# Patient Record
Sex: Female | Born: 1960 | Race: White | Hispanic: No | Smoking: Current every day smoker
Health system: Southern US, Community
[De-identification: ages and names within clinical notes are randomized; demographics above are authoritative.]

## PROBLEM LIST (undated history)

## (undated) DIAGNOSIS — D649 Anemia, unspecified: Secondary | ICD-10-CM

## (undated) DIAGNOSIS — F419 Anxiety disorder, unspecified: Secondary | ICD-10-CM

## (undated) HISTORY — DX: Anemia, unspecified: D64.9

## (undated) HISTORY — PX: TONSILLECTOMY: SUR1361

## (undated) HISTORY — DX: Anxiety disorder, unspecified: F41.9

---

## 2008-01-28 ENCOUNTER — Ambulatory Visit: Payer: Self-pay | Admitting: Family Medicine

## 2008-10-10 ENCOUNTER — Ambulatory Visit: Payer: Self-pay | Admitting: Family Medicine

## 2008-11-07 ENCOUNTER — Ambulatory Visit: Payer: Self-pay | Admitting: Unknown Physician Specialty

## 2009-10-24 ENCOUNTER — Ambulatory Visit: Payer: Self-pay | Admitting: Family Medicine

## 2012-05-03 ENCOUNTER — Ambulatory Visit: Payer: PRIVATE HEALTH INSURANCE

## 2012-05-03 ENCOUNTER — Ambulatory Visit (INDEPENDENT_AMBULATORY_CARE_PROVIDER_SITE_OTHER): Payer: PRIVATE HEALTH INSURANCE | Admitting: Family Medicine

## 2012-05-03 VITALS — BP 135/73 | HR 67 | Temp 98.7°F | Resp 16 | Ht 62.5 in | Wt 193.0 lb

## 2012-05-03 DIAGNOSIS — Z720 Tobacco use: Secondary | ICD-10-CM

## 2012-05-03 DIAGNOSIS — F329 Major depressive disorder, single episode, unspecified: Secondary | ICD-10-CM

## 2012-05-03 DIAGNOSIS — J449 Chronic obstructive pulmonary disease, unspecified: Secondary | ICD-10-CM

## 2012-05-03 DIAGNOSIS — N63 Unspecified lump in unspecified breast: Secondary | ICD-10-CM

## 2012-05-03 DIAGNOSIS — F17200 Nicotine dependence, unspecified, uncomplicated: Secondary | ICD-10-CM

## 2012-05-03 DIAGNOSIS — F430 Acute stress reaction: Secondary | ICD-10-CM

## 2012-05-03 DIAGNOSIS — R05 Cough: Secondary | ICD-10-CM

## 2012-05-03 DIAGNOSIS — M722 Plantar fascial fibromatosis: Secondary | ICD-10-CM

## 2012-05-03 DIAGNOSIS — R928 Other abnormal and inconclusive findings on diagnostic imaging of breast: Secondary | ICD-10-CM

## 2012-05-03 DIAGNOSIS — J9801 Acute bronchospasm: Secondary | ICD-10-CM

## 2012-05-03 MED ORDER — TIOTROPIUM BROMIDE MONOHYDRATE 18 MCG IN CAPS: 18.0000 ug | ORAL_CAPSULE | Freq: Every day | RESPIRATORY_TRACT | Status: AC

## 2012-05-03 MED ORDER — BUPROPION HCL ER (SR) 150 MG PO TB12: 150.0000 mg | ORAL_TABLET | Freq: Two times a day (BID) | ORAL | Status: AC

## 2012-05-03 MED ORDER — LORAZEPAM 1 MG PO TABS: 1.0000 mg | ORAL_TABLET | Freq: Every day | ORAL | Status: AC

## 2012-05-03 MED ORDER — NAPROXEN 500 MG PO TABS: 500.0000 mg | ORAL_TABLET | Freq: Two times a day (BID) | ORAL | Status: AC

## 2012-05-03 NOTE — Progress Notes (Signed)
[address]   [place], [place]  [address]   [phone]  Subjective:    Patient ID: Susan Villanueva, female    DOB: 1960/11/21, 51 y.o.   MRN: [id number]  HPIThis 51 y.o. female presents to establish care and for acute visit:   1.  R breast lump, bruising.  Noticed two weeks ago; knot is smaller since bruising is improving/decreasing.  Notices more when laying supine.  No regular monthly breasts examination.  Last mammogram 12-11-09.    2.  Stress: mother died suddenly.  Daughter moved to [place] and has now moved home.  Ate [employer] with mother; mother developed waves of stomach pain.  Vomiting all day; minimal fluid intake.  Called EMT.  Had SBO; admitted for observation; placed NG tube.  Made her drink Barium.  Onset of back pain severely.  Up all night.  Pt went home at 3:00am; woke up at 9:00am.  Put on ventilator until family could get in.  Died with withdrawal.  Taking nerve pill at increased frequency due to stress.  Lorazepam 1mg  daily.  3.  Wheezing, coughing: smoking 2 ppd.   No recent colds; wheezes at baseline.  No fever/chills/sweats.  No bloody sputum.  4.  Teeth pain: had some teeth replaced; needs refill of Naproxen for PRN teeth pain.    5. Arthralgias:  Needs refill of Naproxen.  Continues to suffer with plantar fasciitis; working two jobs.    6.  Menopause:  LMP three months ago.  +hot flashes but tolerable.  No dysmenorrhea.  +sexually active.   Review of Systems  Constitutional: Negative for fever, chills and fatigue.  Respiratory: Positive for cough and wheezing. Negative for chest tightness and shortness of breath.   Cardiovascular: Negative for chest pain, palpitations and leg swelling.  Genitourinary: Negative for menstrual problem.       See HPI; +breast nodule.  Musculoskeletal: Positive for myalgias and arthralgias. Negative for joint swelling and gait problem.  Psychiatric/Behavioral: Positive for disturbed wake/sleep cycle. Negative for suicidal ideas and  self-injury. The patient is nervous/anxious.        Objective:   Physical Exam  Nursing note and vitals reviewed. Constitutional: She is oriented to person, place, and time. She appears well-developed and well-nourished.  HENT:  Head: Normocephalic and atraumatic.  Eyes: Conjunctivae normal are normal. Pupils are equal, round, and reactive to light.  Neck: Normal range of motion. Neck supple. No thyromegaly present.  Cardiovascular: Normal rate, regular rhythm and normal heart sounds.   No murmur heard. Pulmonary/Chest: Effort normal and breath sounds normal. No respiratory distress. She has no wheezes. She has no rales.  Genitourinary: There is breast tenderness. No breast swelling, discharge or bleeding.       R BREAST: SMALL INDURATION AT 1 O'CLOCK; BRUISING/ECCHYMOSES AT 12 O'CLOCK.  Lymphadenopathy:    She has no cervical adenopathy.  Neurological: She is alert and oriented to person, place, and time.  Psychiatric: She has a normal mood and affect. Her behavior is normal. Judgment and thought content normal.    SPIROMETRY:  FVC 77%, FEV1 74%, FEV1/FVC 96%  -- POOR QUALITY TESTING; ONE ACCEPTABLE TEST; POSSIBLE RESTRICTION.  Results for orders placed in visit on 05/03/12  POCT URINE PREGNANCY      Component Value Range   Preg Test, Ur Negative     [facility] reading (PRIMARY) by  Dr. Katrinka Blazing.  CXR: NAD.      Assessment & Plan:   1. Breast nodule  MM Digital Diagnostic  Bilat, POCT urine pregnancy, DG Chest 2 View  2. Cough  DG Chest 2 View, DG Chest 2 View  3. Bronchospasm    4. Tobacco abuse  DG Chest 2 View  5. Stress reaction  LORazepam (ATIVAN) 1 MG tablet  6. Plantar fasciitis, bilateral  naproxen (NAPROSYN) 500 MG tablet  7. Depression  buPROPion (WELLBUTRIN SR) 150 MG 12 hr tablet  8. COPD (chronic obstructive pulmonary disease)  tiotropium (SPIRIVA HANDIHALER) 18 MCG inhalation capsule     1. Breast Nodule:  New.  Obtain diagnostic mammogram.   2.  Cough:  New.  Obtain CXR.  Likely due to underlying COPD. 3.  Bronchospasm:  New.  Associated with cough, chronic smoking. Trial of Spiriva daily. 4.  Tobacco abuse: contemplative.  Highly encourage smoking cessation with current symptoms of cough, bronchospasm. 5.  Stress reaction:  New.  Many stressors recently; rx for Ativan provided; has been needing Ativan daily thus will add Wellbutrin SR bid. 6.  COPD:  New.  Trial of Spiriva daily.  Repeat spirometry at next visit; poor quality test in office. 7.  Depression:  New.  With anxiety; rx for Ativan and Wellbutrin.   8.  Plantar Fasciitis:  Persistent; refill of Naproxen provided.  Meds ordered this encounter  Medications  . LORazepam (ATIVAN) 1 MG tablet    Sig: Take 1 tablet (1 mg total) by mouth daily.    Dispense:  30 tablet    Refill:  3  . naproxen (NAPROSYN) 500 MG tablet    Sig: Take 1 tablet (500 mg total) by mouth 2 (two) times daily with a meal.    Dispense:  60 tablet    Refill:  5  . buPROPion (WELLBUTRIN SR) 150 MG 12 hr tablet    Sig: Take 1 tablet (150 mg total) by mouth 2 (two) times daily.    Dispense:  60 tablet    Refill:  5  . tiotropium (SPIRIVA HANDIHALER) 18 MCG inhalation capsule    Sig: Place 1 capsule (18 mcg total) into inhaler and inhale daily.    Dispense:  30 capsule    Refill:  12

## 2012-05-03 NOTE — Patient Instructions (Addendum)
1. Breast nodule  MM Digital Diagnostic Bilat, POCT urine pregnancy, DG Chest 2 View  2. Cough  DG Chest 2 View, DG Chest 2 View  3. Bronchospasm    4. Tobacco abuse  DG Chest 2 View  5. Stress reaction  LORazepam (ATIVAN) 1 MG tablet  6. Plantar fasciitis, bilateral  naproxen (NAPROSYN) 500 MG tablet  7. Depression  buPROPion (WELLBUTRIN SR) 150 MG 12 hr tablet  8. COPD (chronic obstructive pulmonary disease)  tiotropium (SPIRIVA HANDIHALER) 18 MCG inhalation capsule     Call Dr. Adolphus Birchwood or Roseanne Kaufman of dermatology.

## 2012-05-05 ENCOUNTER — Ambulatory Visit: Payer: Self-pay | Admitting: Family Medicine

## 2012-05-05 LAB — HM MAMMOGRAPHY: HM Mammogram: NEGATIVE

## 2012-05-10 ENCOUNTER — Telehealth: Payer: Self-pay

## 2012-05-10 NOTE — Telephone Encounter (Signed)
DR Katrinka Blazing. PT'S SLEEPING MEDICATION IS MAKING HER COUGH REALLY BAD DURING THE DAY. [phone]

## 2012-05-11 NOTE — Telephone Encounter (Signed)
Patient states her cough is much worse, she smokes 2 ppd, states Spiriva seems to be making it worse. She states she has always coughed, but now it is worse. I have advised her to come back in for this, she is given Dr  Katrinka Blazing schedule.

## 2012-05-13 ENCOUNTER — Encounter: Payer: Self-pay | Admitting: Radiology

## 2012-05-13 ENCOUNTER — Telehealth: Payer: Self-pay | Admitting: Radiology

## 2012-05-13 NOTE — Telephone Encounter (Signed)
Called patient to advise her Mammogram was normal, her voicemail not set up yet. Will try again later.

## 2012-05-16 NOTE — Telephone Encounter (Signed)
Pt.notified

## 2012-07-06 NOTE — Progress Notes (Signed)
Reviewed and agree.

## 2012-09-07 ENCOUNTER — Encounter: Payer: Self-pay | Admitting: Family Medicine

## 2012-09-07 ENCOUNTER — Ambulatory Visit (INDEPENDENT_AMBULATORY_CARE_PROVIDER_SITE_OTHER): Payer: PRIVATE HEALTH INSURANCE | Admitting: Family Medicine

## 2012-09-07 VITALS — BP 120/72 | HR 68 | Temp 98.3°F | Resp 16 | Ht 62.5 in | Wt 199.2 lb

## 2012-09-07 DIAGNOSIS — F411 Generalized anxiety disorder: Secondary | ICD-10-CM

## 2012-09-07 DIAGNOSIS — Z01419 Encounter for gynecological examination (general) (routine) without abnormal findings: Secondary | ICD-10-CM

## 2012-09-07 DIAGNOSIS — J9801 Acute bronchospasm: Secondary | ICD-10-CM

## 2012-09-07 DIAGNOSIS — F17200 Nicotine dependence, unspecified, uncomplicated: Secondary | ICD-10-CM

## 2012-09-07 DIAGNOSIS — Z716 Tobacco abuse counseling: Secondary | ICD-10-CM

## 2012-09-07 DIAGNOSIS — Z0000 Encounter for general adult medical examination without abnormal findings: Secondary | ICD-10-CM

## 2012-09-07 DIAGNOSIS — Z7189 Other specified counseling: Secondary | ICD-10-CM

## 2012-09-07 LAB — POCT UA - MICROSCOPIC ONLY
Casts, Ur, LPF, POC: NEGATIVE
Crystals, Ur, HPF, POC: NEGATIVE
Yeast, UA: NEGATIVE

## 2012-09-07 LAB — HEMOGLOBIN A1C
Hgb A1c MFr Bld: 6 % — ABNORMAL HIGH (ref <5.7)
Mean Plasma Glucose: 126 mg/dL — ABNORMAL HIGH (ref <117)

## 2012-09-07 LAB — CBC WITH DIFFERENTIAL/PLATELET
Basophils Relative: 1 % (ref 0–1)
Eosinophils Absolute: 0.3 10*3/uL (ref 0.0–0.7)
Eosinophils Relative: 4 % (ref 0–5)
HCT: 37.6 % (ref 36.0–46.0)
Hemoglobin: 12.9 g/dL (ref 12.0–15.0)
Lymphs Abs: 1.4 10*3/uL (ref 0.7–4.0)
MCH: 30.4 pg (ref 26.0–34.0)
MCHC: 34.3 g/dL (ref 30.0–36.0)
MCV: 88.5 fL (ref 78.0–100.0)
Monocytes Absolute: 0.4 10*3/uL (ref 0.1–1.0)
Monocytes Relative: 6 % (ref 3–12)
RBC: 4.25 MIL/uL (ref 3.87–5.11)

## 2012-09-07 LAB — LIPID PANEL
Cholesterol: 204 mg/dL — ABNORMAL HIGH (ref 0–200)
HDL: 41 mg/dL (ref >39)
Total CHOL/HDL Ratio: 5 Ratio

## 2012-09-07 LAB — POCT URINALYSIS DIPSTICK
Bilirubin, UA: NEGATIVE
Leukocytes, UA: NEGATIVE
Nitrite, UA: NEGATIVE
Protein, UA: NEGATIVE
pH, UA: 5.5

## 2012-09-07 LAB — COMPREHENSIVE METABOLIC PANEL
Alkaline Phosphatase: 78 U/L (ref 39–117)
BUN: 21 mg/dL (ref 6–23)
CO2: 23 mEq/L (ref 19–32)
Creat: 0.66 mg/dL (ref 0.50–1.10)
Glucose, Bld: 106 mg/dL — ABNORMAL HIGH (ref 70–99)
Sodium: 137 mEq/L (ref 135–145)
Total Bilirubin: 0.4 mg/dL (ref 0.3–1.2)
Total Protein: 6.5 g/dL (ref 6.0–8.3)

## 2012-09-07 LAB — VITAMIN B12: Vitamin B-12: 407 pg/mL (ref 211–911)

## 2012-09-07 LAB — TSH: TSH: 2.589 u[IU]/mL (ref 0.350–4.500)

## 2012-09-07 LAB — FOLATE: Folate: 4.1 ng/mL

## 2012-09-07 MED ORDER — VARENICLINE TARTRATE 0.5 MG X 11 & 1 MG X 42 PO MISC: ORAL | Status: DC

## 2012-09-07 MED ORDER — SERTRALINE HCL 50 MG PO TABS: 50.0000 mg | ORAL_TABLET | Freq: Every day | ORAL | Status: AC

## 2012-09-07 MED ORDER — VARENICLINE TARTRATE 1 MG PO TABS: 1.0000 mg | ORAL_TABLET | Freq: Two times a day (BID) | ORAL | Status: DC

## 2012-09-07 NOTE — Patient Instructions (Addendum)
Routine general medical examination at a health care facility - Plan: CBC with Differential, Comprehensive metabolic panel, Hemoglobin A1c, Lipid panel, TSH, Vitamin B12, Vitamin D 25 hydroxy, Folate, POCT urinalysis dipstick, EKG 12-Lead, POCT UA - Microscopic Only  Anxiety state, unspecified  Tobacco abuse counseling  Bronchospasm

## 2012-09-07 NOTE — Progress Notes (Signed)
[address]   [place], [place]  [address]   [phone]  Subjective:    Patient ID: Susan Villanueva, female    DOB: 10-23-1960, 52 y.o.   MRN: [id number]  HPI This 52 y.o. female presents for evaluation for CPE.    Last physical ten years ago. Pap smear gynecology 5 years ago; no gynecological exam since then. Mammogram 06/2012. Colonoscopy never.  TDAP UTD. Pneumovax never. Influenza vaccines never. Eye exam ten years ago; +readers. Dental exam 11/2011.  Anxiety:  Intolerant to Wellbutrin; became very irritable.  Interested in different medication.  Easily irritated.  Bronchospasm:  Suffered with horrible cough with Spiriva; used for one week and stopped; cough resolved when stopping Spiriva.  Now will wheeze with prolonged exertion.   Tobacco abuse: intolerant to Wellbutrin; really would like to try something to help her quit smoking.  Has never used Chantix but willing to try.  Plantar Fasciitis: s/p podiatry consult; s/p B feet injection; no improvement in feet pain; inserts fitted for shoes without improvement.  Labs negative for gout.  Very disappointed with visit.    Review of Systems  Constitutional: Positive for fatigue. Negative for fever, chills, diaphoresis, activity change, appetite change and unexpected weight change.  HENT: Negative for hearing loss, nosebleeds, congestion, sore throat, facial swelling, rhinorrhea, sneezing, drooling, mouth sores, trouble swallowing, neck pain, neck stiffness, dental problem, voice change, postnasal drip, sinus pressure, tinnitus and ear discharge.   Eyes: Negative for photophobia, pain, discharge, redness, itching and visual disturbance.  Respiratory: Positive for cough and wheezing. Negative for apnea, choking, chest tightness, shortness of breath and stridor.   Cardiovascular: Positive for chest pain. Negative for palpitations and leg swelling.  Gastrointestinal: Negative for nausea, vomiting, abdominal pain, diarrhea,  constipation, blood in stool, abdominal distention, anal bleeding and rectal pain.  Endocrine: Negative for cold intolerance, heat intolerance, polydipsia, polyphagia and polyuria.  Genitourinary: Negative for dysuria, urgency, frequency, hematuria, flank pain, decreased urine volume, vaginal bleeding, vaginal discharge, enuresis, difficulty urinating, genital sores, menstrual problem, pelvic pain and dyspareunia.  Musculoskeletal: Positive for myalgias, back pain, joint swelling and arthralgias. Negative for gait problem.  Skin: Negative for color change, pallor, rash and wound.  Allergic/Immunologic: Negative for environmental allergies, food allergies and immunocompromised state.  Neurological: Positive for headaches. Negative for dizziness, tremors, seizures, syncope, facial asymmetry, speech difficulty, weakness, light-headedness and numbness.  Hematological: Negative for adenopathy. Does not bruise/bleed easily.  Psychiatric/Behavioral: Negative for suicidal ideas, hallucinations, behavioral problems, confusion, sleep disturbance, self-injury, dysphoric mood, decreased concentration and agitation. The patient is nervous/anxious. The patient is not hyperactive.         Past Medical History  Diagnosis Date  . Anemia   . Anxiety     Past Surgical History  Procedure Laterality Date  . Tonsillectomy      age 20    Prior to Admission medications   Medication Sig Start Date End Date Taking? Authorizing Provider  LORazepam (ATIVAN) 1 MG tablet Take 1 tablet (1 mg total) by mouth daily. 05/03/12  Yes Ethelda Chick, MD  naproxen (NAPROSYN) 500 MG tablet Take 1 tablet (500 mg total) by mouth 2 (two) times daily with a meal. 05/03/12  Yes Ethelda Chick, MD  OVER THE COUNTER MEDICATION Tart Cherry OTC taking   Yes Historical Provider, MD  OVER THE COUNTER MEDICATION Celery Seed OTC taking   Yes Historical Provider, MD  buPROPion (WELLBUTRIN SR) 150 MG 12 hr tablet Take 1 tablet (150 mg  total)  by mouth 2 (two) times daily. 05/03/12   Ethelda Chick, MD  sertraline (ZOLOFT) 50 MG tablet Take 1 tablet (50 mg total) by mouth daily. For anxiety/nerves 09/07/12   Ethelda Chick, MD  tiotropium (SPIRIVA HANDIHALER) 18 MCG inhalation capsule Place 1 capsule (18 mcg total) into inhaler and inhale daily. 05/03/12   Ethelda Chick, MD  varenicline (CHANTIX CONTINUING MONTH PAK) 1 MG tablet Take 1 tablet (1 mg total) by mouth 2 (two) times daily. 09/07/12   Ethelda Chick, MD  varenicline (CHANTIX STARTING MONTH PAK) 0.5 MG X 11 & 1 MG X 42 tablet Take one 0.5 mg tablet by mouth once daily for 3 days, then increase to one 0.5 mg tablet twice daily for 4 days, then increase to one 1 mg tablet twice daily. 09/07/12   Ethelda Chick, MD    Allergies  Allergen Reactions  . Codeine     History   Social History  . Marital Status: Divorced    Spouse Name: N/A    Number of Children: 1  . Years of Education: N/A   Occupational History  .      works at [employer]; also works at [employer] in [place].   Social History Main Topics  . Smoking status: Current Every Day Smoker    Types: Cigarettes  . Smokeless tobacco: Not on file  . Alcohol Use: 0.0 oz/week    1-2 Glasses of wine per week     Comment: daily marijuana use.  . Drug Use: Yes    Special: Marijuana  . Sexually Active: Yes   Other Topics Concern  . Not on file   Social History Narrative   Marital: single; dating casually.      Children: 1 daughter (13); no grandchildren.      Lives: with daughter.      Employment: works 40 hours at [employer]; works at [employer] 15-20 hours per week.      Tobacco: 1 - 1.5 ppd x 38 years.      Alcohol:  Socially; two drinks once per month; usually designated driver.  No DUIs.      Drugs: marijuana socially; daily.      Exercise: minimal.      Sexual activity:  No history of STDs; total partners 200+ all before 40.      [place]:  100%.      Guns: none    Family History    Problem Relation Age of Onset  . Thyroid disease Mother   . Bipolar disorder Mother   . Cancer Mother     Bladder  . Hypertension Mother   . Arthritis Father     DDD lumbar  . Cancer Maternal Grandmother   . Cancer Maternal Grandfather   . Diabetes Maternal Grandfather   . Arthritis Paternal Grandmother   . Stroke Paternal Grandfather   . Hypertension Sister   . Arthritis Sister     Objective:   Physical Exam  Nursing note and vitals reviewed. Constitutional: She is oriented to person, place, and time. She appears well-developed and well-nourished. No distress.  HENT:  Head: Normocephalic and atraumatic.  Right Ear: External ear normal.  Left Ear: External ear normal.  Nose: Nose normal.  Mouth/Throat: Oropharynx is clear and moist.  Eyes: Conjunctivae and EOM are normal. Pupils are equal, round, and reactive to light.  Neck: Normal range of motion. Neck supple. No JVD present. No tracheal deviation present. No thyromegaly present.  Cardiovascular: Normal rate, regular rhythm, normal heart sounds and intact distal pulses.  Exam reveals no gallop and no friction rub.   No murmur heard. Pulmonary/Chest: Effort normal and breath sounds normal. No respiratory distress. She has no wheezes. She has no rales.  Abdominal: Soft. Bowel sounds are normal. She exhibits no distension and no mass. There is no tenderness. There is no rebound and no guarding. Hernia confirmed negative in the right inguinal area and confirmed negative in the left inguinal area.  Genitourinary: Vagina normal and uterus normal. Rectal exam shows external hemorrhoid. Rectal exam shows no fissure, no mass, no tenderness and anal tone normal. No breast swelling, tenderness, discharge or bleeding. There is no rash, tenderness or lesion on the right labia. There is no rash, tenderness or lesion on the left labia. Cervix exhibits no motion tenderness, no discharge and no friability. Right adnexum displays no mass, no  tenderness and no fullness. Left adnexum displays no mass, no tenderness and no fullness. No vaginal discharge found.  Musculoskeletal: She exhibits no edema and no tenderness.  Lymphadenopathy:    She has no cervical adenopathy.       Right: No inguinal adenopathy present.       Left: No inguinal adenopathy present.  Neurological: She is alert and oriented to person, place, and time. She has normal reflexes. No cranial nerve deficit. She exhibits normal muscle tone. Coordination normal.  Skin: Skin is warm and dry. No rash noted. She is not diaphoretic. No erythema. No pallor.  Psychiatric: She has a normal mood and affect. Her behavior is normal. Judgment and thought content normal.   EKG: NSR;  SPIROMETRY COMPLETED IN OFFICE.     Assessment & Plan:  Routine general medical examination at a health care facility - Plan: CBC with Differential, Comprehensive metabolic panel, Hemoglobin A1c, Lipid panel, TSH, Vitamin B12, Vitamin D 25 hydroxy, Folate, POCT urinalysis dipstick, EKG 12-Lead, POCT UA - Microscopic Only, IFOBT POC (occult bld, rslt in office), Pap IG, CT/NG NAA, and HPV (high risk)  Anxiety state, unspecified  Tobacco abuse counseling  Bronchospasm - Plan: Spirometry with graph    Meds ordered this encounter  Medications  . OVER THE COUNTER MEDICATION    Sig: Tart Cherry OTC taking  . OVER THE COUNTER MEDICATION    Sig: Celery Seed OTC taking  . sertraline (ZOLOFT) 50 MG tablet    Sig: Take 1 tablet (50 mg total) by mouth daily. For anxiety/nerves    Dispense:  30 tablet    Refill:  5  . varenicline (CHANTIX STARTING MONTH PAK) 0.5 MG X 11 & 1 MG X 42 tablet    Sig: Take one 0.5 mg tablet by mouth once daily for 3 days, then increase to one 0.5 mg tablet twice daily for 4 days, then increase to one 1 mg tablet twice daily.    Dispense:  53 tablet    Refill:  0  . varenicline (CHANTIX CONTINUING MONTH PAK) 1 MG tablet    Sig: Take 1 tablet (1 mg total) by mouth 2  (two) times daily.    Dispense:  60 tablet    Refill:  2

## 2012-09-07 NOTE — Progress Notes (Signed)
  Subjective:    Patient ID: Susan Villanueva, female    DOB: 1960/09/02, 52 y.o.   MRN: [id number]  HPI    Review of Systems  Constitutional: Positive for fatigue.  HENT: Positive for tinnitus.   Eyes: Negative.   Respiratory: Positive for cough and wheezing.   Cardiovascular: Negative.   Gastrointestinal: Negative.   Endocrine: Negative.   Genitourinary: Negative.   Musculoskeletal: Positive for gait problem.  Skin: Negative.   Neurological: Negative.   Hematological: Negative.   Psychiatric/Behavioral: Negative.        Objective:   Physical Exam        Assessment & Plan:

## 2012-09-09 LAB — PAP IG, CT-NG NAA, HPV HIGH-RISK
Chlamydia Probe Amp: NEGATIVE
GC Probe Amp: NEGATIVE

## 2012-09-14 DIAGNOSIS — Z01419 Encounter for gynecological examination (general) (routine) without abnormal findings: Secondary | ICD-10-CM | POA: Insufficient documentation

## 2012-09-14 DIAGNOSIS — F411 Generalized anxiety disorder: Secondary | ICD-10-CM | POA: Insufficient documentation

## 2012-09-14 DIAGNOSIS — Z716 Tobacco abuse counseling: Secondary | ICD-10-CM | POA: Insufficient documentation

## 2012-09-14 DIAGNOSIS — Z0000 Encounter for general adult medical examination without abnormal findings: Secondary | ICD-10-CM | POA: Insufficient documentation

## 2012-09-14 DIAGNOSIS — J9801 Acute bronchospasm: Secondary | ICD-10-CM | POA: Insufficient documentation

## 2012-09-14 NOTE — Assessment & Plan Note (Signed)
Contemplative at this time; rx for Chantix starter pack and continuous pack provided; advised on use, side effects, efficacy, expense.

## 2012-09-14 NOTE — Assessment & Plan Note (Signed)
Uncontrolled; rx for Zoloft 50mg  daily provided; continue Lorazepam PRN.

## 2012-09-14 NOTE — Assessment & Plan Note (Signed)
Pap smear obtained; mammogram UTD.  Perimenopausal currently.

## 2012-09-14 NOTE — Assessment & Plan Note (Signed)
Anticipatory guidance --- tobacco cessation, exercise, weight loss.  Pap smear obtained; mammogram UTD. Hemosure obtained; refused colonoscopy.  Immunizations discussed; declined Pneumovax or influenza vaccines.  Obtain labs.

## 2012-09-14 NOTE — Assessment & Plan Note (Signed)
Persistent; rx for Albuterol provided to use as needed.  Encourage smoking cessation; spirometry completed.

## 2012-11-23 ENCOUNTER — Encounter: Payer: Self-pay | Admitting: Family Medicine

## 2013-04-22 ENCOUNTER — Other Ambulatory Visit: Payer: Self-pay | Admitting: Podiatry

## 2013-04-22 DIAGNOSIS — M25571 Pain in right ankle and joints of right foot: Secondary | ICD-10-CM

## 2013-04-23 ENCOUNTER — Other Ambulatory Visit: Payer: Self-pay

## 2013-04-25 ENCOUNTER — Other Ambulatory Visit: Payer: Self-pay

## 2013-04-27 ENCOUNTER — Ambulatory Visit
Admission: RE | Admit: 2013-04-27 | Discharge: 2013-04-27 | Disposition: A | Discharge status: Home / Self Care | Payer: No Typology Code available for payment source | Source: Ambulatory Visit | Attending: Podiatry | Admitting: Podiatry

## 2013-04-27 DIAGNOSIS — M25571 Pain in right ankle and joints of right foot: Secondary | ICD-10-CM

## 2013-05-02 ENCOUNTER — Encounter: Payer: Self-pay | Admitting: Podiatry

## 2013-05-02 ENCOUNTER — Ambulatory Visit (INDEPENDENT_AMBULATORY_CARE_PROVIDER_SITE_OTHER): Payer: Self-pay | Admitting: Podiatry

## 2013-05-02 VITALS — BP 147/78 | HR 80 | Resp 12 | Ht 62.0 in | Wt 200.0 lb

## 2013-05-02 DIAGNOSIS — M659 Synovitis and tenosynovitis, unspecified: Secondary | ICD-10-CM

## 2013-05-02 MED ORDER — TRAMADOL HCL 50 MG PO TABS: 50.0000 mg | ORAL_TABLET | Freq: Three times a day (TID) | ORAL | Status: AC

## 2013-05-02 NOTE — Progress Notes (Signed)
Subjective:     Patient ID: Susan Villanueva, female   DOB: 08-Nov-1960, 52 y.o.   MRN: [id number]  HPI patient is here to discuss the results of her MRI of her right ankle and foot. States that the swelling has been quite a bit and that she has trouble doing a lot of walking. Has not been able to wear her boot as much as we wanted   Review of Systems  All other systems reviewed and are negative.       Objective:   Physical Exam  Nursing note and vitals reviewed. Constitutional: She appears well-developed and well-nourished.  Cardiovascular: Intact distal pulses.   Musculoskeletal: Normal range of motion.  Neurological: She is alert.  Skin: Skin is warm.   Edema noted in the right ankle medial side with pain extending from the malleolus to the navicular.    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 Patient wants to get this fixed and I'm hoping to make an arrangement with the surgical center to provide this service. Reviewed surgery and recovery and will discuss in greater detail when we can get her booked

## 2013-05-26 ENCOUNTER — Other Ambulatory Visit: Payer: Self-pay

## 2013-06-02 ENCOUNTER — Telehealth: Payer: Self-pay | Admitting: *Deleted

## 2013-06-02 NOTE — Telephone Encounter (Signed)
PT CALLED WANTING TO KNOW IF YOU HAD SET HER UP FOR SURGERY IN December. SAID SHE HAS NO INSURANCE AND YOU WERE GOING TO TALK TO THE SURGERY CENTER ABT GETTING HER SCHEDULED. SHE IS WEARING A BOOT AND WANTS A NOTE FOR WORK STATING SHE HAS TO WEAR IT. HOW LONG DOES SHE NEED TO WEAR THE BOOT? SHE IS STATING SHE WEARS IT UNTIL SHE HAS SURGERY?

## 2013-06-03 ENCOUNTER — Encounter: Payer: Self-pay | Admitting: *Deleted

## 2013-06-03 NOTE — Telephone Encounter (Signed)
Per dr. Charlsie Merles: will talk to the surgery center next week to see about setting up surgery for pt. Will write pt note to wear boot till 12.31.14 for work

## 2013-08-17 IMAGING — MG MM CAD SCREENING MAMMO
1 series · 4 of 4 positions shown · non-contrast
Comparison: 10/10/2008.

REASON FOR EXAM: SCR MAMMO NO ORDER
COMMENTS:

PROCEDURE:     MAM - MAM DGTL SCRN MAM NO ORDER W/CAD  - May 05, 2012  [DATE]
RESULT:

[R CC · right · 4 of 4 slices shown]
[im 1/4]
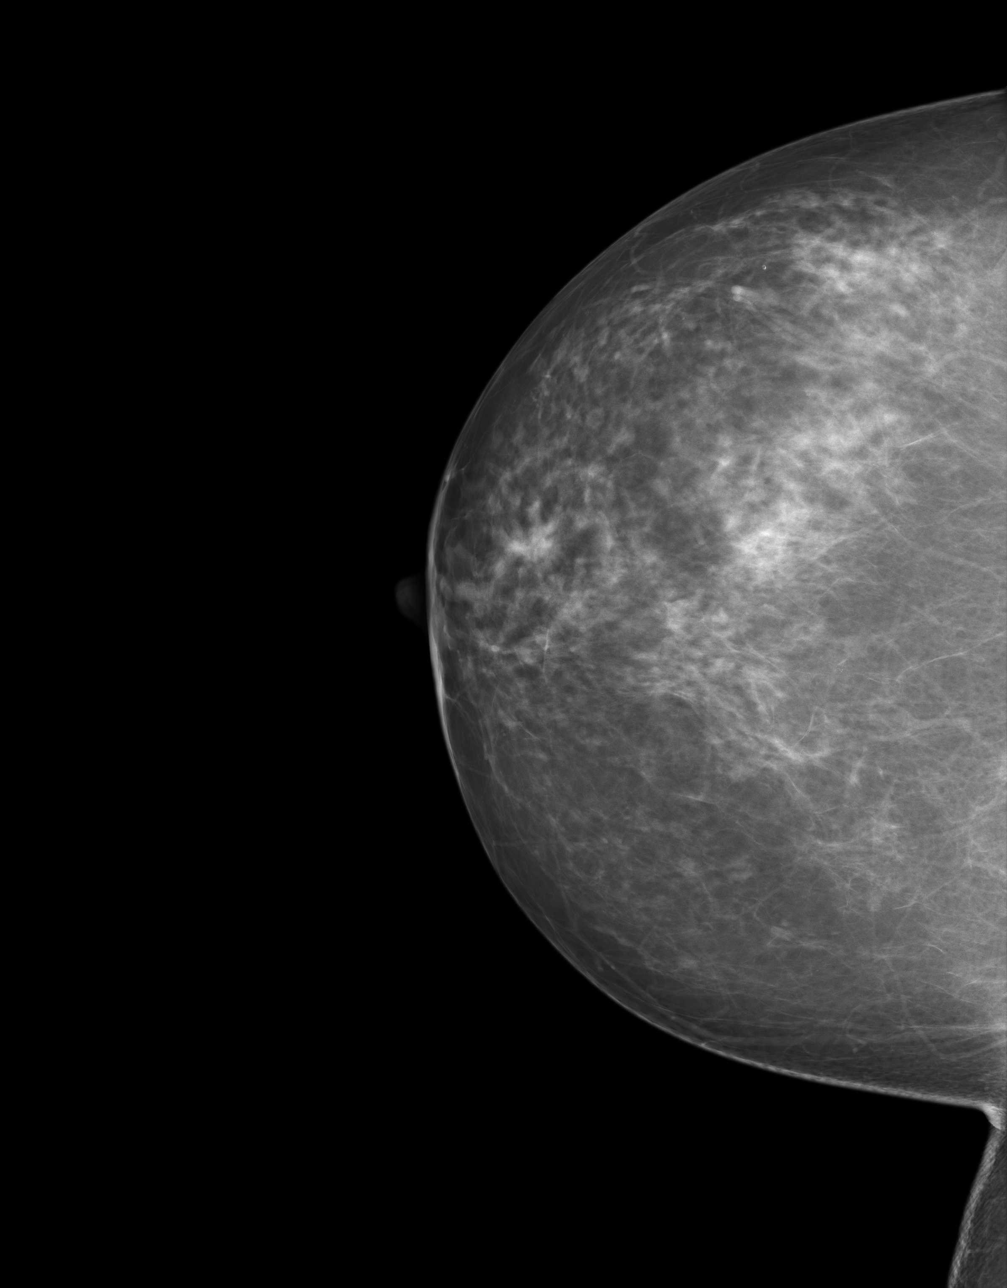
[im 2/4]
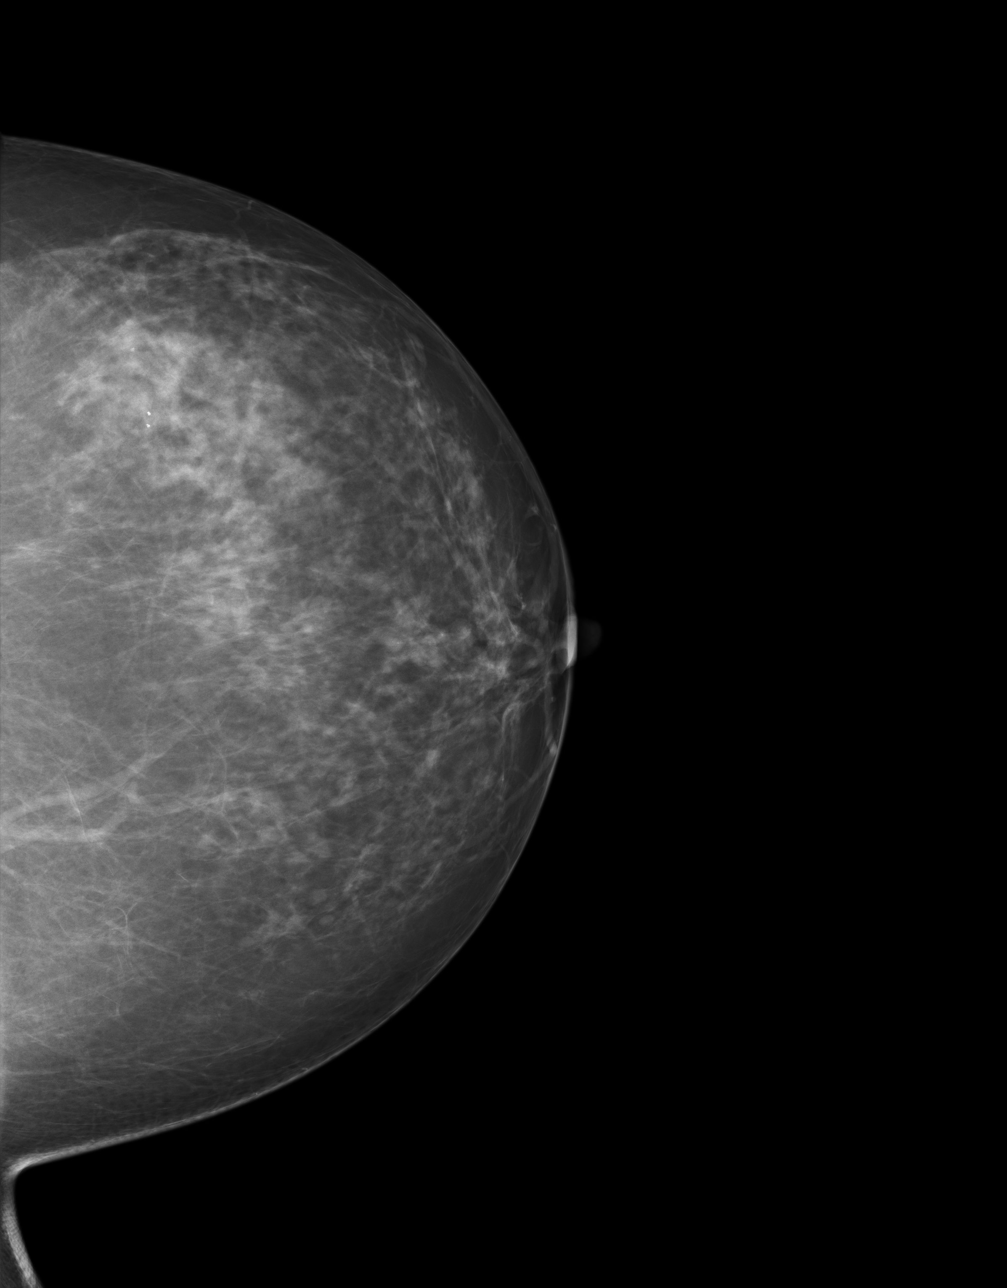
[im 3/4]
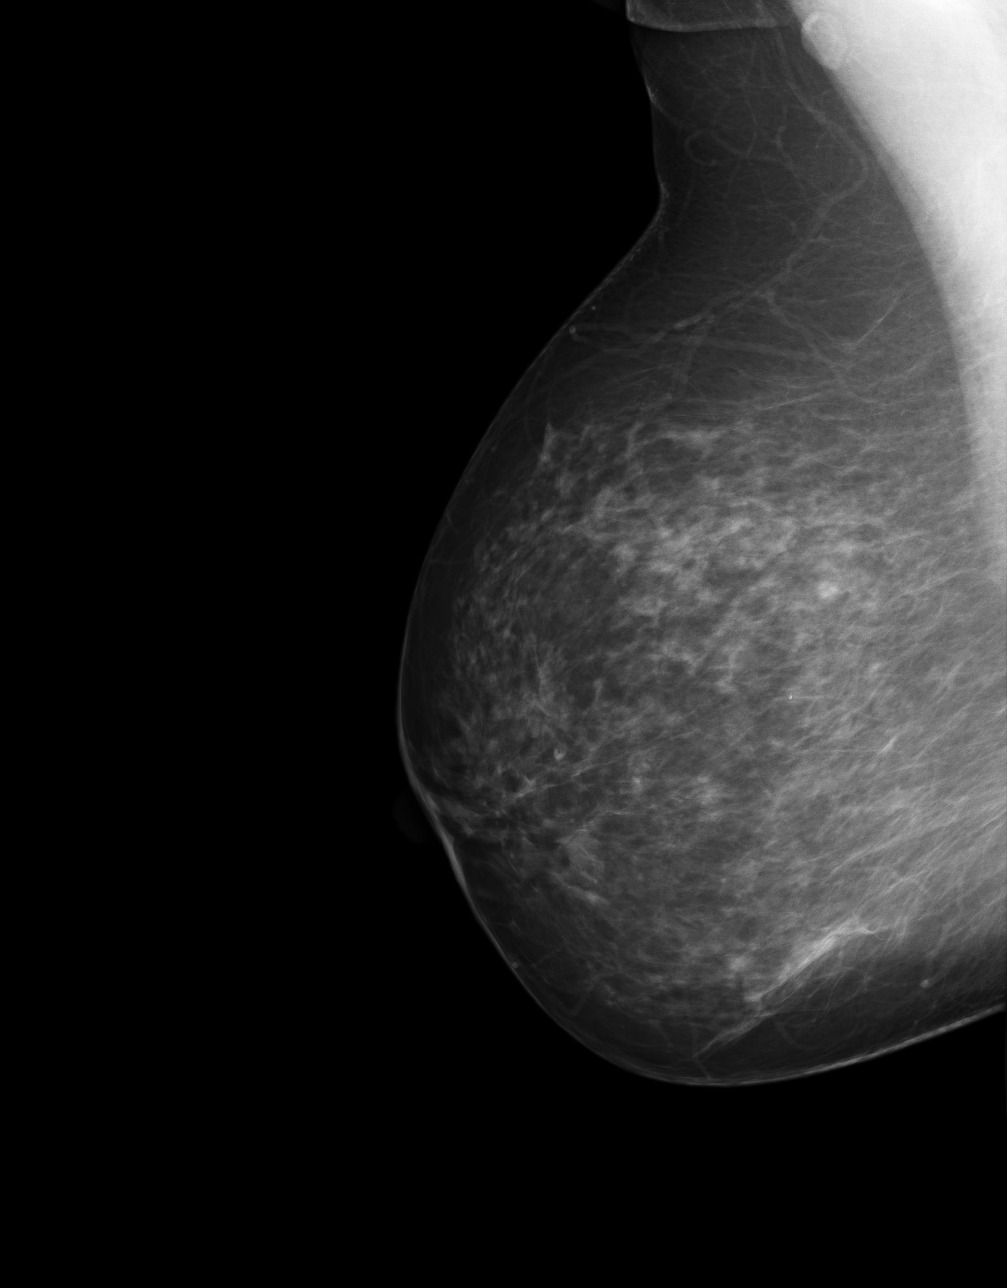
[im 4/4]
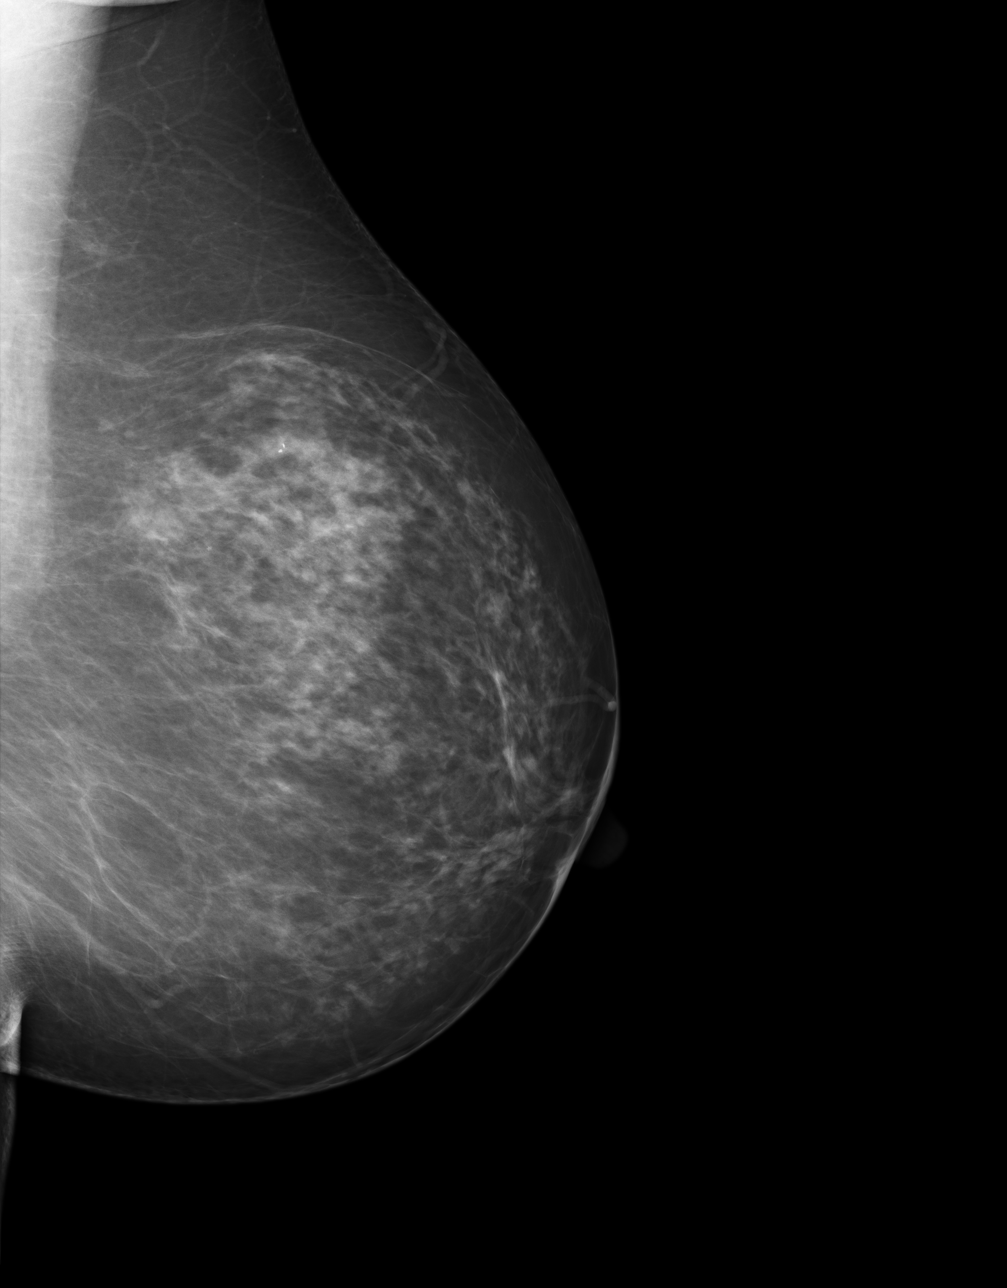

[4 of 4 positions shown; findings below may reference images not displayed]

FINDINGS: The breast tissue is heterogeneously dense, which may lower the sensitivity
of mammography. No suspicious masses or calcifications are identified. No
areas of architectural distortion.
IMPRESSION: BI-RADS: Category 1 - Negative

Recommend continued annual screening mammography.

A NEGATIVE MAMMOGRAM REPORT DOES NOT PRECLUDE BIOPSY OR OTHER EVALUATION OF
A CLINICALLY PALPABLE OR OTHERWISE SUSPICIOUS MASS OR LESION. BREAST CANCER
MAY NOT BE DETECTED BY MAMMOGRAPHY IN UP TO 10% OF CASES.

## 2015-02-14 ENCOUNTER — Telehealth: Payer: Self-pay

## 2015-02-14 NOTE — Telephone Encounter (Signed)
Patient is requesting a refill for tramadol. She doesn't have insurance and would like to know is it could be prescribed without being seen. Please call and advise! [phone]

## 2015-02-14 NOTE — Telephone Encounter (Signed)
I have not seen patient in 2.5 years; she needs OV for any prescriptions.

## 2015-02-15 NOTE — Telephone Encounter (Signed)
Called pt, unable to leave message.

## 2017-11-11 ENCOUNTER — Ambulatory Visit
Admission: EM | Admit: 2017-11-11 | Discharge: 2017-11-11 | Disposition: A | Discharge status: Home / Self Care | Payer: No Typology Code available for payment source | Attending: Family Medicine | Admitting: Family Medicine

## 2017-11-11 ENCOUNTER — Other Ambulatory Visit: Payer: Self-pay

## 2017-11-11 DIAGNOSIS — R05 Cough: Secondary | ICD-10-CM

## 2017-11-11 DIAGNOSIS — Z720 Tobacco use: Secondary | ICD-10-CM

## 2017-11-11 LAB — COMPREHENSIVE METABOLIC PANEL
ALT: 33 U/L (ref 14–54)
AST: 24 U/L (ref 15–41)
Albumin: 4.1 g/dL (ref 3.5–5.0)
Alkaline Phosphatase: 78 U/L (ref 38–126)
Anion gap: 6 (ref 5–15)
BUN: 15 mg/dL (ref 6–20)
CHLORIDE: 107 mmol/L (ref 101–111)
CO2: 27 mmol/L (ref 22–32)
CREATININE: 0.61 mg/dL (ref 0.44–1.00)
Calcium: 9 mg/dL (ref 8.9–10.3)
GFR calc Af Amer: >60 mL/min (ref >60)
GFR calc non Af Amer: >60 mL/min (ref >60)
Glucose, Bld: 132 mg/dL — ABNORMAL HIGH (ref 65–99)
POTASSIUM: 4.8 mmol/L (ref 3.5–5.1)
SODIUM: 140 mmol/L (ref 135–145)
Total Bilirubin: 0.2 mg/dL — ABNORMAL LOW (ref 0.3–1.2)
Total Protein: 7.6 g/dL (ref 6.5–8.1)

## 2017-11-11 MED ORDER — VARENICLINE TARTRATE 0.5 MG PO TABS: ORAL_TABLET | ORAL | 0 refills | Status: AC

## 2017-11-11 MED ORDER — VARENICLINE TARTRATE 1 MG PO TABS: 1.0000 mg | ORAL_TABLET | Freq: Two times a day (BID) | ORAL | 0 refills | Status: AC

## 2017-11-11 NOTE — ED Triage Notes (Signed)
Pt with cough and clear nasal drainage. "I think it's allergies."

## 2017-11-11 NOTE — ED Provider Notes (Addendum)
[facility]    CSN: [id number] Arrival date & time: 11/11/17  7829     History   Chief Complaint Chief Complaint  Patient presents with  . Allergies    HPI Susan Villanueva is a 57 y.o. female.   57 yo female chronic smoker presents with a c/o "wanting to quit smoking". Patient states she's been smoking for many years and would like to quit. Currently denies any fevers, chills, chest pain or shortness of breath. States she called a local primary care office to establish care and be seen for this and was told to "go to Urgent Care".   The history is provided by the patient.  URI    Past Medical History:  Diagnosis Date  . Anemia   . Anxiety     Patient Active Problem List   Diagnosis Date Noted  . Routine general medical examination at a health care facility 09/14/2012  . Anxiety state, unspecified 09/14/2012  . Tobacco abuse counseling 09/14/2012  . Bronchospasm 09/14/2012  . Routine gynecological examination 09/14/2012    Past Surgical History:  Procedure Laterality Date  . TONSILLECTOMY     age 65    OB History   None      Home Medications    Prior to Admission medications   Medication Sig Start Date End Date Taking? Authorizing Provider  buPROPion (WELLBUTRIN SR) 150 MG 12 hr tablet Take 1 tablet (150 mg total) by mouth 2 (two) times daily. 05/03/12   Ethelda Chick, MD  LORazepam (ATIVAN) 1 MG tablet Take 1 tablet (1 mg total) by mouth daily. 05/03/12   Ethelda Chick, MD  naproxen (NAPROSYN) 500 MG tablet Take 1 tablet (500 mg total) by mouth 2 (two) times daily with a meal. 05/03/12   Ethelda Chick, MD  OVER THE COUNTER MEDICATION Tart Cherry OTC taking    [provider]  OVER THE COUNTER MEDICATION Celery Seed OTC taking    [provider]  sertraline (ZOLOFT) 50 MG tablet Take 1 tablet (50 mg total) by mouth daily. For anxiety/nerves 09/07/12   Ethelda Chick, MD  tiotropium (SPIRIVA HANDIHALER) 18 MCG inhalation  capsule Place 1 capsule (18 mcg total) into inhaler and inhale daily. 05/03/12   Ethelda Chick, MD  traMADol (ULTRAM) 50 MG tablet Take 1 tablet (50 mg total) by mouth 3 (three) times daily. 05/02/13   Lenn Sink, DPM  varenicline (CHANTIX) 0.5 MG tablet 1 tab po qd x 3 days, then 1 tab po bid for the next 4 days 11/11/17   Payton Mccallum, MD  varenicline (CHANTIX) 1 MG tablet Take 1 tablet (1 mg total) by mouth 2 (two) times daily. 11/11/17   Payton Mccallum, MD    Family History Family History  Problem Relation Age of Onset  . Thyroid disease Mother   . Bipolar disorder Mother   . Cancer Mother        Bladder  . Hypertension Mother   . Arthritis Father        DDD lumbar  . Cancer Maternal Grandmother   . Cancer Maternal Grandfather   . Diabetes Maternal Grandfather   . Arthritis Paternal Grandmother   . Stroke Paternal Grandfather   . Hypertension Sister   . Arthritis Sister     Social History Social History   Tobacco Use  . Smoking status: Current Every Day Smoker    Packs/day: 1.50    Years: 40.00    Pack years:  60.00    Types: Cigarettes  . Smokeless tobacco: Never Used  Substance Use Topics  . Alcohol use: Yes    Alcohol/week: 0.0 oz    Types: 1 - 2 Glasses of wine per week    Comment: monthy  . Drug use: Yes    Types: Marijuana    Comment: several times weekly     Allergies   Codeine   Review of Systems Review of Systems   Physical Exam Triage Vital Signs ED Triage Vitals  Enc Vitals Group     BP 11/11/17 0840 (!) 184/83     Pulse Rate 11/11/17 0840 68     Resp 11/11/17 0840 20     Temp 11/11/17 0840 98.6 F (37 C)     Temp Source 11/11/17 0840 Oral     SpO2 11/11/17 0840 94 %     Weight 11/11/17 0841 235 lb (106.6 kg)     Height 11/11/17 0841 5\' 2"  (1.575 m)     Head Circumference --      Peak Flow --      Pain Score 11/11/17 0841 0     Pain Loc --      Pain Edu? --      Excl. in GC? --    No data found.  Updated Vital Signs BP  (!) 184/83 (BP Location: Left Arm)   Pulse 68   Temp 98.6 F (37 C) (Oral)   Resp 20   Ht 5\' 2"  (1.575 m)   Wt 235 lb (106.6 kg)   SpO2 94%   BMI 42.98 kg/m   Visual Acuity Right Eye Distance:   Left Eye Distance:   Bilateral Distance:    Right Eye Near:   Left Eye Near:    Bilateral Near:     Physical Exam  Constitutional: She appears well-developed and well-nourished. No distress.  HENT:  Head: Normocephalic and atraumatic.  Nose: Mucosal edema and rhinorrhea present. No nose lacerations, sinus tenderness, nasal deformity, septal deviation or nasal septal hematoma. No epistaxis.  No foreign bodies. Right sinus exhibits maxillary sinus tenderness and frontal sinus tenderness. Left sinus exhibits maxillary sinus tenderness and frontal sinus tenderness.  Mouth/Throat: Uvula is midline, oropharynx is clear and moist and mucous membranes are normal. No oropharyngeal exudate.  Eyes: Conjunctivae are normal. Right eye exhibits no discharge. Left eye exhibits no discharge. No scleral icterus.  Neck: Normal range of motion. Neck supple. No thyromegaly present.  Cardiovascular: Normal rate, regular rhythm and normal heart sounds.  Pulmonary/Chest: Effort normal and breath sounds normal. No stridor. No respiratory distress. She has no wheezes. She has no rales.  Lymphadenopathy:    She has no cervical adenopathy.  Skin: She is not diaphoretic.  Nursing note and vitals reviewed.    UC Treatments / Results  Labs (all labs ordered are listed, but only abnormal results are displayed) Labs Reviewed  COMPREHENSIVE METABOLIC PANEL - Abnormal; Notable for the following components:      Result Value   Glucose, Bld 132 (*)    Total Bilirubin 0.2 (*)    All other components within normal limits    EKG None Radiology No results found.  Procedures Procedures (including critical care time)  Medications Ordered in UC Medications - No data to display   Initial Impression /  Assessment and Plan / UC Course  I have reviewed the triage vital signs and the nursing notes.  Pertinent labs & imaging results that were available during my care of  the patient were reviewed by me and considered in my medical decision making (see chart for details).       Final Clinical Impressions(s) / UC Diagnoses   Final diagnoses:  Tobacco abuse  (Chronic)   ED Discharge Orders        Ordered    varenicline (CHANTIX) 0.5 MG tablet     11/11/17 0949    varenicline (CHANTIX) 1 MG tablet  2 times daily     11/11/17 0949     1. Lab results and diagnosis of this chronic condition reviewed with patient 2. Discussed with patient that I would prescribe rx just to get her started only,  however explained that this is a chronic condition that needs to be followed and managed by a Primary Care Provider and not at Urgent Care 3. rx as per orders above; reviewed possible side effects, interactions, risks and benefits  3. Follow-up with Primary Care for ongoing chronic management of this chronic condition.   Controlled Substance Prescriptions [place] Controlled Substance Registry consulted? Not Applicable   Payton Mccallum, MD 11/11/17 4098    Payton Mccallum, MD 11/11/17 [phone]

## 2017-11-11 NOTE — Discharge Instructions (Addendum)
Discussed/explained to patient that this is a chronic condition that needs to be managed and followed by a Primary Care Provider and for her to follow up there for ongoing chronic condition management

## 2019-10-20 ENCOUNTER — Ambulatory Visit: Payer: Medicaid Other | Attending: Internal Medicine

## 2019-10-20 DIAGNOSIS — Z23 Encounter for immunization: Secondary | ICD-10-CM

## 2019-10-20 NOTE — Progress Notes (Signed)
   Covid-19 Vaccination Clinic  Name:  Susan Villanueva    MRN: [id number] DOB: July 19, 1961  10/20/2019  Susan Villanueva was observed post Covid-19 immunization for 15 minutes without incident. She was provided with Vaccine Information Sheet and instruction to access the V-Safe system.   Susan Villanueva was instructed to call 911 with any severe reactions post vaccine: Marland Kitchen Difficulty breathing  . Swelling of face and throat  . A fast heartbeat  . A bad rash all over body  . Dizziness and weakness   Immunizations Administered    Name Date Dose VIS Date Route   Pfizer COVID-19 Vaccine 10/20/2019  9:59 AM 0.3 mL 07/01/2019 Intramuscular   Manufacturer: [employer], [employer]   Lot: [id number]   NDC: [address]-3

## 2019-11-16 ENCOUNTER — Other Ambulatory Visit: Payer: Self-pay

## 2019-11-16 ENCOUNTER — Ambulatory Visit: Payer: Medicaid Other | Attending: Internal Medicine

## 2019-11-16 DIAGNOSIS — Z23 Encounter for immunization: Secondary | ICD-10-CM

## 2019-11-16 NOTE — Progress Notes (Signed)
   Covid-19 Vaccination Clinic  Name:  ASTORIA CONDON    MRN: [id number] DOB: April 01, 1961  11/16/2019  Ms. Carvell was observed post Covid-19 immunization for 15 minutes without incident. She was provided with Vaccine Information Sheet and instruction to access the V-Safe system.   Ms. Lyday was instructed to call 911 with any severe reactions post vaccine: Marland Kitchen Difficulty breathing  . Swelling of face and throat  . A fast heartbeat  . A bad rash all over body  . Dizziness and weakness   Immunizations Administered    Name Date Dose VIS Date Route   Pfizer COVID-19 Vaccine 11/16/2019  4:51 PM 0.3 mL 09/14/2018 Intramuscular   Manufacturer: [employer], [employer]   Lot: [id number]   NDC: [address]-9
